# Patient Record
Sex: Female | Born: 1995 | Race: White | Hispanic: No | Marital: Single | Smoking: Current every day smoker
Health system: Southern US, Community
[De-identification: ages and names within clinical notes are randomized; demographics above are authoritative.]

## PROBLEM LIST (undated history)

## (undated) ENCOUNTER — Inpatient Hospital Stay (HOSPITAL_COMMUNITY): Payer: Self-pay

## (undated) DIAGNOSIS — J45909 Unspecified asthma, uncomplicated: Secondary | ICD-10-CM

## (undated) HISTORY — PX: OTHER SURGICAL HISTORY: SHX169

## (undated) HISTORY — DX: Unspecified asthma, uncomplicated: J45.909

## (undated) HISTORY — PX: TYMPANOSTOMY TUBE PLACEMENT: SHX32

---
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
## 2014-01-01 MED ORDER — IBUPROFEN 600 MG PO TABS: 600.0000 mg | ORAL_TABLET | Freq: Four times a day (QID) | ORAL | Status: DC | PRN

## 2014-01-01 NOTE — Discharge Summary (Signed)
Obstetric Discharge Summary Reason for Admission: onset of labor Prenatal Procedures: ultrasound Intrapartum Procedures: spontaneous vaginal delivery Postpartum Procedures: none Complications-Operative and Postpartum: none HEMOGLOBIN  Date Value Ref Range Status  12/30/2013 12.8 12.0 - 15.0 g/dL Final   HCT  Date Value Ref Range Status  12/30/2013 36.9 36.0 - 46.0 % Final   Hospital course: Teen G1 at 40 wks presented for active labor. [facility] at 9 weeks of pregnancy. +chlamydia during first trimester of pregnancy. Pregnancy dated by first trimester ultrasound.Progressed well.  12/30/13 a viable and healthy female was delivered via Vaginal, Spontaneous Delivery (Presentation: ;  ).  APGAR: 9, 9; weight 8 lb 0.8 oz (3650 g).   Placenta status: Intact, Spontaneous.  Cord: 3 vessels with the following complications: None.   Anesthesia: Epidural  Episiotomy: None Lacerations: 2nd degree;Perineal Suture Repair: 3.0 vicryl rapide Est. Blood Loss (mL): 450  Physical Exam:  General: alert, cooperative and no distress Lochia: appropriate Uterine Fundus: firm Incision: n/a DVT Evaluation: No evidence of DVT seen on physical exam.  Discharge Diagnoses: Term Pregnancy-delivered  Discharge Information: Date: 01/01/2014 Activity: pelvic rest Diet: routine Medications: PNV and Ibuprofen Condition: stable Instructions: refer to practice specific booklet Discharge to: home Follow-up Information    Follow up with [facility]. Schedule an appointment as soon as possible for a visit in 1 month.   Contact information:   [address] [place] [address] [phone]       Newborn Data: Live born female  Birth Weight: 8 lb 0.8 oz (3650 g) APGAR: 9, 9  Home with mother.  Sandra Meadows 01/01/2014, 10:43 AM

## 2014-01-01 NOTE — Lactation Note (Addendum)
This note was copied from the chart of Sandra [place][name]. Lactation Consultation Note Mom isn't want to BF w/NS, or wear shells and bra. Wanting to give bottle w/formula because the baby is hungry. Mom is flat and unable to put baby to breast for adequate BF and milk transfer. She states she doesn't like them. Gave the option of pumping and giving breast milk in bottle. Unable to get any colostrum at this time to give to baby w/pump. Explained keeping the stimulation every 3 hrs. To encourage the milk to come. Mom has 2 manual pumps at home and will be getting a DEBP through her insurance. Baby does have a recessed chin, limited tongue movement, and upper lip frenulum. Mom said she probably will be pumping and bottle feeding, and supplementing until her milk comes in. Encouraged to come to out pt. Services either support group or LC OP services. Patient Name: Sandra Meadows MVHQI'OToday's Date: 01/01/2014 Reason for consult: Follow-up assessment   Maternal Data    Feeding Feeding Type: Formula Nipple Type: Slow - flow  LATCH Score/Interventions                      Lactation Tools Discussed/Used     Consult Status Consult Status: Complete Date: 01/01/14 Follow-up type: Call as needed    Raudel Bazen, Diamond NickelLAURA G 01/01/2014, 4:52 AM

## 2014-01-01 NOTE — Discharge Instructions (Signed)
Postpartum Care After Vaginal Delivery °After you deliver your newborn (postpartum period), the usual stay in the hospital is 24-72 hours. If there were problems with your labor or delivery, or if you have other medical problems, you might be in the hospital longer.  °While you are in the hospital, you will receive help and instructions on how to care for yourself and your newborn during the postpartum period.  °While you are in the hospital: °· Be sure to tell your nurses if you have pain or discomfort, as well as where you feel the pain and what makes the pain worse. °· If you had an incision made near your vagina (episiotomy) or if you had some tearing during delivery, the nurses may put ice packs on your episiotomy or tear. The ice packs may help to reduce the pain and swelling. °· If you are breastfeeding, you may feel uncomfortable contractions of your uterus for a couple of weeks. This is normal. The contractions help your uterus get back to normal size. °· It is normal to have some bleeding after delivery. °· For the first 1-3 days after delivery, the flow is red and the amount may be similar to a period. °· It is common for the flow to start and stop. °· In the first few days, you may pass some small clots. Let your nurses know if you begin to pass large clots or your flow increases. °· Do not  flush blood clots down the toilet before having the nurse look at them. °· During the next 3-10 days after delivery, your flow should become more watery and pink or brown-tinged in color. °· Ten to fourteen days after delivery, your flow should be a small amount of yellowish-white discharge. °· The amount of your flow will decrease over the first few weeks after delivery. Your flow may stop in 6-8 weeks. Most women have had their flow stop by 12 weeks after delivery. °· You should change your sanitary pads frequently. °· Wash your hands thoroughly with soap and water for at least 20 seconds after changing pads, using  the toilet, or before holding or feeding your newborn. °· You should feel like you need to empty your bladder within the first 6-8 hours after delivery. °· In case you become weak, lightheaded, or faint, call your nurse before you get out of bed for the first time and before you take a shower for the first time. °· Within the first few days after delivery, your breasts may begin to feel tender and full. This is called engorgement. Breast tenderness usually goes away within 48-72 hours after engorgement occurs. You may also notice milk leaking from your breasts. If you are not breastfeeding, do not stimulate your breasts. Breast stimulation can make your breasts produce more milk. °· Spending as much time as possible with your newborn is very important. During this time, you and your newborn can feel close and get to know each other. Having your newborn stay in your room (rooming in) will help to strengthen the bond with your newborn.  It will give you time to get to know your newborn and become comfortable caring for your newborn. °· Your hormones change after delivery. Sometimes the hormone changes can temporarily cause you to feel sad or tearful. These feelings should not last more than a few days. If these feelings last longer than that, you should talk to your caregiver. °· If desired, talk to your caregiver about methods of family planning or contraception. °·   Talk to your caregiver about immunizations. Your caregiver may want you to have the following immunizations before leaving the hospital: °· Tetanus, diphtheria, and pertussis (Tdap) or tetanus and diphtheria (Td) immunization. It is very important that you and your family (including grandparents) or others caring for your newborn are up-to-date with the Tdap or Td immunizations. The Tdap or Td immunization can help protect your newborn from getting ill. °· Rubella immunization. °· Varicella (chickenpox) immunization. °· Influenza immunization. You should  receive this annual immunization if you did not receive the immunization during your pregnancy. °Document Released: 10/19/2006 Document Revised: 09/16/2011 Document Reviewed: 08/19/2011 °ExitCare® Patient Information ©2015 [employer]. This information is not intended to replace advice given to you by your health care provider. Make sure you discuss any questions you have with your health care provider. ° °Contraception Choices °Contraception (birth control) is the use of any methods or devices to prevent pregnancy. Below are some methods to help avoid pregnancy. °HORMONAL METHODS  °· Contraceptive implant. This is a thin, plastic tube containing progesterone hormone. It does not contain estrogen hormone. Your health care provider inserts the tube in the inner part of the upper arm. The tube can remain in place for up to 3 years. After 3 years, the implant must be removed. The implant prevents the ovaries from releasing an egg (ovulation), thickens the cervical mucus to prevent sperm from entering the uterus, and thins the lining of the inside of the uterus. °· Progesterone-only injections. These injections are given every 3 months by your health care provider to prevent pregnancy. This synthetic progesterone hormone stops the ovaries from releasing eggs. It also thickens cervical mucus and changes the uterine lining. This makes it harder for sperm to survive in the uterus. °· Birth control pills. These pills contain estrogen and progesterone hormone. They work by preventing the ovaries from releasing eggs (ovulation). They also cause the cervical mucus to thicken, preventing the sperm from entering the uterus. Birth control pills are prescribed by a health care provider. Birth control pills can also be used to treat heavy periods. °· Minipill. This type of birth control pill contains only the progesterone hormone. They are taken every day of each month and must be prescribed by your health care provider. °· Birth  control patch. The patch contains hormones similar to those in birth control pills. It must be changed once a week and is prescribed by a health care provider. °· Vaginal ring. The ring contains hormones similar to those in birth control pills. It is left in the vagina for 3 weeks, removed for 1 week, and then a new one is put back in place. The patient must be comfortable inserting and removing the ring from the vagina. A health care provider's prescription is necessary. °· Emergency contraception. Emergency contraceptives prevent pregnancy after unprotected sexual intercourse. This pill can be taken right after sex or up to 5 days after unprotected sex. It is most effective the sooner you take the pills after having sexual intercourse. Most emergency contraceptive pills are available without a prescription. Check with your pharmacist. Do not use emergency contraception as your only form of birth control. °BARRIER METHODS  °· Female condom. This is a thin sheath (latex or rubber) that is worn over the penis during sexual intercourse. It can be used with spermicide to increase effectiveness. °· Female condom. This is a soft, loose-fitting sheath that is put into the vagina before sexual intercourse. °· Diaphragm. This is a soft, latex, dome-shaped barrier that   must be fitted by a health care provider. It is inserted into the vagina, along with a spermicidal jelly. It is inserted before intercourse. The diaphragm should be left in the vagina for 6 to 8 hours after intercourse. °· Cervical cap. This is a round, soft, latex or plastic cup that fits over the cervix and must be fitted by a health care provider. The cap can be left in place for up to 48 hours after intercourse. °· Sponge. This is a soft, circular piece of polyurethane foam. The sponge has spermicide in it. It is inserted into the vagina after wetting it and before sexual intercourse. °· Spermicides. These are chemicals that kill or block sperm from entering  the cervix and uterus. They come in the form of creams, jellies, suppositories, foam, or tablets. They do not require a prescription. They are inserted into the vagina with an applicator before having sexual intercourse. The process must be repeated every time you have sexual intercourse. °INTRAUTERINE CONTRACEPTION °· Intrauterine device (IUD). This is a T-shaped device that is put in a woman's uterus during a menstrual period to prevent pregnancy. There are 2 types: °¨ Copper IUD. This type of IUD is wrapped in copper wire and is placed inside the uterus. Copper makes the uterus and fallopian tubes produce a fluid that kills sperm. It can stay in place for 10 years. °¨ Hormone IUD. This type of IUD contains the hormone progestin (synthetic progesterone). The hormone thickens the cervical mucus and prevents sperm from entering the uterus, and it also thins the uterine lining to prevent implantation of a fertilized egg. The hormone can weaken or kill the sperm that get into the uterus. It can stay in place for 3-5 years, depending on which type of IUD is used. °PERMANENT METHODS OF CONTRACEPTION °· Female tubal ligation. This is when the woman's fallopian tubes are surgically sealed, tied, or blocked to prevent the egg from traveling to the uterus. °· Hysteroscopic sterilization. This involves placing a small coil or insert into each fallopian tube. Your doctor uses a technique called hysteroscopy to do the procedure. The device causes scar tissue to form. This results in permanent blockage of the fallopian tubes, so the sperm cannot fertilize the egg. It takes about 3 months after the procedure for the tubes to become blocked. You must use another form of birth control for these 3 months. °· Female sterilization. This is when the female has the tubes that carry sperm tied off (vasectomy). This blocks sperm from entering the vagina during sexual intercourse. After the procedure, the man can still ejaculate fluid  (semen). °NATURAL PLANNING METHODS °· Natural family planning. This is not having sexual intercourse or using a barrier method (condom, diaphragm, cervical cap) on days the woman could become pregnant. °· Calendar method. This is keeping track of the length of each menstrual cycle and identifying when you are fertile. °· Ovulation method. This is avoiding sexual intercourse during ovulation. °· Symptothermal method. This is avoiding sexual intercourse during ovulation, using a thermometer and ovulation symptoms. °· Post-ovulation method. This is timing sexual intercourse after you have ovulated. °Regardless of which type or method of contraception you choose, it is important that you use condoms to protect against the transmission of sexually transmitted infections (STIs). Talk with your health care provider about which form of contraception is most appropriate for you. °Document Released: 12/22/2004 Document Revised: 12/27/2012 Document Reviewed: 06/16/2012 °ExitCare® Patient Information ©2015 [employer]. This information is not intended to replace advice   given to you by your health care provider. Make sure you discuss any questions you have with your health care provider. ° °

## 2014-02-01 ENCOUNTER — Ambulatory Visit (INDEPENDENT_AMBULATORY_CARE_PROVIDER_SITE_OTHER): Payer: Medicaid Other | Admitting: Obstetrics & Gynecology

## 2014-02-01 ENCOUNTER — Encounter: Payer: Self-pay | Admitting: Obstetrics & Gynecology

## 2014-02-01 MED ORDER — MISOPROSTOL 200 MCG PO TABS: ORAL_TABLET | ORAL | Status: DC

## 2014-02-01 NOTE — Progress Notes (Signed)
  Subjective:     Sandra LeavellVictoria R Meadows is a 19 y.o. female who presents for a postpartum visit. She is 4 weeks postpartum following a spontaneous vaginal delivery. I have fully reviewed the prenatal and intrapartum course. The delivery was at 40 gestational weeks. Outcome: spontaneous vaginal delivery. Anesthesia: epidural. Postpartum course has been "great". Baby's course has been "great". Baby is feeding by bottle - Similac Advance. Bleeding no bleeding. Bowel function is normal. Bladder function is normal. Patient is not sexually active. Contraception method is abstinence. Postpartum depression screening: negative.  The following portions of the patient's history were reviewed and updated as appropriate: allergies, current medications, past family history, past medical history, past social history, past surgical history and problem list.  Review of Systems Pertinent items are noted in HPI.   Objective:    BP 117/74 mmHg  Pulse 89  Temp(Src) 98.2 F (36.8 C) (Oral)  Resp 18  Wt 181 lb 11.2 oz (82.419 kg)  Breastfeeding? No  General:  alert   Breasts:  inspection negative, no nipple discharge or bleeding, no masses or nodularity palpable  Lungs: clear to auscultation bilaterally  Heart:  regular rate and rhythm, S1, S2 normal, no murmur, click, rub or gallop  Abdomen: soft, non-tender; bowel sounds normal; no masses,  no organomegaly   Vulva:  normal  Vagina: not evaluated  Cervix:  anteverted  Corpus: normal  Adnexa:  normal adnexa  Rectal Exam: Not performed.        I attempted to do a Mirena insertion. However the tenaculem ripped throught both the anterior and posterior lips of the cervix. Bleeding was controlled with Monsel's.      Assessment:     Normal postpartum exam. Pap smear not done at today's visit.   Plan:    1. Contraception: She will come back for a Mirena

## 2014-02-02 LAB — POCT PREGNANCY, URINE: Preg Test, Ur: NEGATIVE

## 2014-02-09 ENCOUNTER — Encounter: Payer: Self-pay | Admitting: General Practice

## 2014-02-21 ENCOUNTER — Ambulatory Visit: Payer: Medicaid Other | Admitting: Obstetrics & Gynecology

## 2014-03-21 ENCOUNTER — Ambulatory Visit (INDEPENDENT_AMBULATORY_CARE_PROVIDER_SITE_OTHER): Payer: BLUE CROSS/BLUE SHIELD | Admitting: Obstetrics & Gynecology

## 2014-03-21 ENCOUNTER — Encounter: Payer: Self-pay | Admitting: Obstetrics & Gynecology

## 2014-03-21 VITALS — BP 127/73 | HR 96 | Temp 98.5°F | Wt 176.3 lb

## 2014-03-21 DIAGNOSIS — Z30430 Encounter for insertion of intrauterine contraceptive device: Secondary | ICD-10-CM

## 2014-03-21 DIAGNOSIS — Z3202 Encounter for pregnancy test, result negative: Secondary | ICD-10-CM

## 2014-03-21 LAB — POCT PREGNANCY, URINE: PREG TEST UR: NEGATIVE

## 2014-03-21 MED ORDER — LEVONORGESTREL 20 MCG/24HR IU IUD
INTRAUTERINE_SYSTEM | Freq: Once | INTRAUTERINE | Status: AC
  Administered 2014-03-21: 1 via INTRAUTERINE

## 2014-03-21 NOTE — Addendum Note (Signed)
Addended by: Louanna RawAMPBELL, Tomorrow Dehaas M on: 03/21/2014 03:26 PM   Modules accepted: Orders

## 2014-03-21 NOTE — Progress Notes (Signed)
   Subjective:    Patient ID: Sandra LeavellVictoria R Badia, female    DOB: [birth year], 19 y.o.   MRN: [id number]  HPI  19 yo P1 here for a second attempt at Mirena insertion. She forgot to take the cytotec last night.  Review of Systems     Objective:   Physical Exam  UPT negative, consent signed, Time out procedure done. Cervix prepped with betadine  Mirena was easily placed and the strings were cut to 3-4 cm. Uterus sounded to 9 cm. She tolerated the procedure well.        Assessment & Plan:  Contraception- Mirena RTC 4 weeks for string check

## 2014-04-25 ENCOUNTER — Encounter: Payer: Self-pay | Admitting: Obstetrics & Gynecology

## 2014-04-25 ENCOUNTER — Ambulatory Visit (INDEPENDENT_AMBULATORY_CARE_PROVIDER_SITE_OTHER): Payer: Self-pay | Admitting: Obstetrics & Gynecology

## 2014-04-25 VITALS — BP 143/99 | HR 98 | Temp 98.0°F | Ht 62.0 in | Wt 167.8 lb

## 2014-04-25 DIAGNOSIS — Z30431 Encounter for routine checking of intrauterine contraceptive device: Secondary | ICD-10-CM

## 2014-04-25 NOTE — Progress Notes (Signed)
   Subjective:    Patient ID: Sandra LeavellVictoria R Meadows, female    DOB: [birth year], 19 y.o.   MRN: [id number]  HPI  19 yo P211 with a 573 month old son is here today for a string check. She got her Mirena a month ago. She has some daily spotting, but is pleased with the Mirena. No partner pain with sex.  Review of Systems     Objective:   Physical Exam [id number] Breathing and ambulating normally Mirena strings seen        Assessment & Plan:  Contraception- Mirena RTC 1 year/prn sooner

## 2015-01-22 ENCOUNTER — Ambulatory Visit (INDEPENDENT_AMBULATORY_CARE_PROVIDER_SITE_OTHER): Payer: BLUE CROSS/BLUE SHIELD | Admitting: Family Medicine

## 2015-01-22 VITALS — BP 124/78 | HR 85 | Temp 98.8°F | Resp 17 | Ht 62.5 in | Wt 157.0 lb

## 2015-01-22 DIAGNOSIS — Z111 Encounter for screening for respiratory tuberculosis: Secondary | ICD-10-CM

## 2015-01-22 DIAGNOSIS — R748 Abnormal levels of other serum enzymes: Secondary | ICD-10-CM | POA: Diagnosis not present

## 2015-01-22 DIAGNOSIS — Z130 Encounter for screening for diseases of the blood and blood-forming organs and certain disorders involving the immune mechanism: Secondary | ICD-10-CM

## 2015-01-22 DIAGNOSIS — Z0000 Encounter for general adult medical examination without abnormal findings: Secondary | ICD-10-CM

## 2015-01-22 NOTE — Progress Notes (Signed)
  Tuberculosis Risk Questionnaire  1. No Were you born outside the [place] in one of the following parts of the world: [place], [place], [place], [place] or [place]?    2. No Have you traveled outside the [place] and lived for more than one month in one of the following parts of the world: [place], [place], [place], [place] or [place]?    3. No Do you have a compromised immune system such as from any of the following conditions:HIV/AIDS, organ or bone marrow transplantation, diabetes, immunosuppressive medicines (e.g. Prednisone, Remicaide), leukemia, lymphoma, cancer of the head or neck, gastrectomy or jejunal bypass, end-stage renal disease (on dialysis), or silicosis?     4. Yes nursing Have you ever or do you plan on working in: a residential care center, a health care facility, a jail or prison or homeless shelter?    5. No Have you ever: injected illegal drugs, used crack cocaine, lived in a homeless shelter  or been in jail or prison?     6. No Have you ever been exposed to anyone with infectious tuberculosis?    Tuberculosis Symptom Questionnaire  Do you currently have any of the following symptoms?  1. No Unexplained cough lasting more than 3 weeks?   2. No Unexplained fever lasting more than 3 weeks.   3. No Night Sweats (sweating that leaves the bedclothes and sheets wet)     4. No Shortness of Breath   5. No Chest Pain   6. No Unintentional weight loss    7. No Unexplained fatigue (very tired for no reason)

## 2015-01-22 NOTE — Progress Notes (Signed)
      Chief Complaint:  Chief Complaint  Patient presents with  . Immunizations    TB,     HPI: Sandra Meadows is a 20 y.o. 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   Past Medical History  Diagnosis Date  . Childhood asthma    Past Surgical History  Procedure Laterality Date  . Ears    . Tympanostomy tube placement     Social History   Social History  . Marital Status: Single    Spouse Name: N/A  . Number of Children: N/A  . Years of Education: N/A   Social History Main Topics  . Smoking status: Current Every Day Smoker -- 0.50 packs/day for 6 years    Types: Cigarettes  . Smokeless tobacco: Never Used  . Alcohol Use: No  . Drug Use: No  . Sexual Activity: Not Currently     Comment: HAS QUIT   Other Topics Concern  . None   Social History Narrative   Family History  Problem Relation Age of Onset  . Cancer Maternal Grandfather    No Known Allergies Prior to Admission medications   Not on File     ROS: The patient denies fevers, chills, night sweats, unintentional weight loss, chest pain, palpitations, wheezing, dyspnea on exertion, nausea, vomiting, abdominal pain, dysuria, hematuria, melena, numbness, weakness, or tingling.  All other systems have been reviewed and were otherwise negative with the exception of those mentioned in the HPI and as above.    PHYSICAL EXAM: Filed Vitals:   01/22/15 1701  BP: 124/78  Pulse: 85  Temp: 98.8 F (37.1 C)  Resp: 17   Body mass index is 28.24 kg/(m^2).   General: Alert, no acute distress HEENT:  Normocephalic, atraumatic, oropharynx patent. EOMI, PERRLA Cardiovascular:  Regular rate and rhythm, no rubs murmurs or  gallops.  No Carotid bruits, radial pulse intact. No pedal edema.  Respiratory: Clear to auscultation bilaterally.  No wheezes, rales, or rhonchi.  No cyanosis, no use of accessory musculature Abdominal: No organomegaly, abdomen is soft and non-tender, positive bowel sounds. No masses. Skin: No rashes. Neurologic: Facial musculature symmetric. Psychiatric: Patient acts appropriately throughout our interaction. Lymphatic: No cervical or submandibular lymphadenopathy Musculoskeletal: Gait intact. No edema, tenderness   LABS: Results for orders placed or performed in visit on 03/21/14  Pregnancy, urine POC  Result Value Ref Range   Preg Test, Ur NEGATIVE NEGATIVE     EKG/XRAY:   Primary read interpreted by Dr. Marin Comment at [facility].   ASSESSMENT/PLAN: Encounter Diagnoses  Name Primary?  . Annual physical exam Yes  . Screening-pulmonary TB   . Screening for deficiency anemia   . Abnormal liver enzymes    Labs pending Will need titers for school PPD given Fu in 2 days   Gross sideeffects, risk and benefits, and alternatives of medications d/w patient. Patient is aware that all medications have potential sideeffects and we are unable to predict every sideeffect or drug-drug interaction that may occur.  Jaxxen Voong DO  01/22/2015 5:26 PM

## 2015-01-23 LAB — CBC
HCT: 40.9 % (ref 36.0–46.0)
Hemoglobin: 13.8 g/dL (ref 12.0–15.0)
MCH: 29.1 pg (ref 26.0–34.0)
MCHC: 33.7 g/dL (ref 30.0–36.0)
MCV: 86.3 fL (ref 78.0–100.0)
MPV: 12.2 fL (ref 8.6–12.4)
Platelets: 227 10*3/uL (ref 150–400)
RBC: 4.74 MIL/uL (ref 3.87–5.11)
RDW: 15 % (ref 11.5–15.5)
WBC: 10.4 10*3/uL (ref 4.0–10.5)

## 2015-01-23 LAB — COMPLETE METABOLIC PANEL WITH GFR
Alkaline Phosphatase: 64 U/L (ref 33–115)
BUN: 8 mg/dL (ref 7–25)
Calcium: 9.4 mg/dL (ref 8.6–10.2)
Chloride: 105 mmol/L (ref 98–110)
Creat: 0.68 mg/dL (ref 0.50–1.10)
GFR, Est African American: >89 mL/min (ref >=60)
GFR, Est Non African American: >89 mL/min (ref >=60)
Glucose, Bld: 90 mg/dL (ref 65–99)
Potassium: 4.6 mmol/L (ref 3.5–5.3)
Total Bilirubin: 0.4 mg/dL (ref 0.2–1.2)
Total Protein: 7.3 g/dL (ref 6.1–8.1)

## 2015-01-23 LAB — COMPLETE METABOLIC PANEL WITHOUT GFR
ALT: 14 U/L (ref 6–29)
AST: 13 U/L (ref 10–30)
Albumin: 4.2 g/dL (ref 3.6–5.1)
CO2: 27 mmol/L (ref 20–31)
Sodium: 139 mmol/L (ref 135–146)

## 2015-01-23 LAB — MEASLES/MUMPS/RUBELLA IMMUNITY
Mumps IgG: 73.1 [AU]/ml — ABNORMAL HIGH (ref <9.00)
Rubella: 2.15 Index — ABNORMAL HIGH (ref <0.90)
Rubeola IgG: 136 [AU]/ml — ABNORMAL HIGH (ref <25.00)

## 2015-01-23 LAB — VARICELLA ZOSTER ANTIBODY, IGG: Varicella IgG: 204.5 Index — ABNORMAL HIGH (ref <135.00)

## 2015-01-24 ENCOUNTER — Ambulatory Visit (INDEPENDENT_AMBULATORY_CARE_PROVIDER_SITE_OTHER): Payer: BLUE CROSS/BLUE SHIELD | Admitting: *Deleted

## 2015-01-24 DIAGNOSIS — Z111 Encounter for screening for respiratory tuberculosis: Secondary | ICD-10-CM

## 2015-01-24 LAB — TB SKIN TEST
Induration: 0 mm
TB Skin Test: NEGATIVE

## 2015-01-24 NOTE — Progress Notes (Signed)
   Subjective:    Patient ID: Sandra Meadows, female    DOB: 04/14/1995, 19 y.o.   MRN: [id number]  HPI    Review of Systems     Objective:   Physical Exam        Assessment & Plan:   

## 2015-02-05 ENCOUNTER — Ambulatory Visit (INDEPENDENT_AMBULATORY_CARE_PROVIDER_SITE_OTHER): Payer: BLUE CROSS/BLUE SHIELD

## 2015-02-05 DIAGNOSIS — Z111 Encounter for screening for respiratory tuberculosis: Secondary | ICD-10-CM | POA: Diagnosis not present

## 2015-02-05 DIAGNOSIS — Z23 Encounter for immunization: Secondary | ICD-10-CM | POA: Diagnosis not present

## 2015-02-05 NOTE — Progress Notes (Signed)
   Subjective:    Patient ID: Sandra Meadows, female    DOB: 1995-09-27, 20 y.o.   MRN: [id number]  HPI    Review of Systems     Objective:   Physical Exam        Assessment & Plan:

## 2015-02-05 NOTE — Progress Notes (Signed)
  Tuberculosis Risk Questionnaire  1. No Were you born outside the [place] in one of the following parts of the world: [place], [place], [place], [place] or Eastern [place]?    2. No Have you traveled outside the [place] and lived for more than one month in one of the following parts of the world: [place], [place], [place], [place] or [place]?    3. No Do you have a compromised immune system such as from any of the following conditions:HIV/AIDS, organ or bone marrow transplantation, diabetes, immunosuppressive medicines (e.g. Prednisone, Remicaide), leukemia, lymphoma, cancer of the head or neck, gastrectomy or jejunal bypass, end-stage renal disease (on dialysis), or silicosis?     4. Yes Have you ever or do you plan on working in: a residential care center, a health care facility, a jail or prison or homeless shelter? Health care facility     5. No Have you ever: injected illegal drugs, used crack cocaine, lived in a homeless shelter  or been in jail or prison?     6. No Have you ever been exposed to anyone with infectious tuberculosis?    Tuberculosis Symptom Questionnaire  Do you currently have any of the following symptoms?  1. No Unexplained cough lasting more than 3 weeks?   2. No Unexplained fever lasting more than 3 weeks.   3. No Night Sweats (sweating that leaves the bedclothes and sheets wet)     4. No Shortness of Breath   5. No Chest Pain   6. No Unintentional weight loss    7. No Unexplained fatigue (very tired for no reason)   

## 2015-02-05 NOTE — Progress Notes (Deleted)
   Subjective:    Patient ID: Sandra Meadows, female    DOB: 10/20/1995, 20 y.o.   MRN: [id number]  HPI    Review of Systems     Objective:   Physical Exam        Assessment & Plan:   

## 2015-02-08 ENCOUNTER — Ambulatory Visit (INDEPENDENT_AMBULATORY_CARE_PROVIDER_SITE_OTHER): Payer: BLUE CROSS/BLUE SHIELD | Admitting: *Deleted

## 2015-02-08 DIAGNOSIS — Z111 Encounter for screening for respiratory tuberculosis: Secondary | ICD-10-CM

## 2015-02-08 LAB — TB SKIN TEST
Induration: 0 mm
TB SKIN TEST: NEGATIVE

## 2016-04-22 IMAGING — US US OB TRANSVAGINAL
1 series · 14 of 28 positions shown · non-contrast
Comparison: None.

CLINICAL DATA: Pelvic pain and cramping. Vaginal bleeding.
Gestational age by LMP of 5 weeks 4 days.

EXAM:
OBSTETRIC <14 WK US AND TRANSVAGINAL OB US
TECHNIQUE: Both transabdominal and transvaginal ultrasound examinations were
performed for complete evaluation of the gestation as well as the
maternal uterus, adnexal regions, and pelvic cul-de-sac.
Transvaginal technique was performed to assess early pregnancy.

[Series 1: us ob transvaginal · 42 acquisitions, 14 frames shown]
[im 2/42]
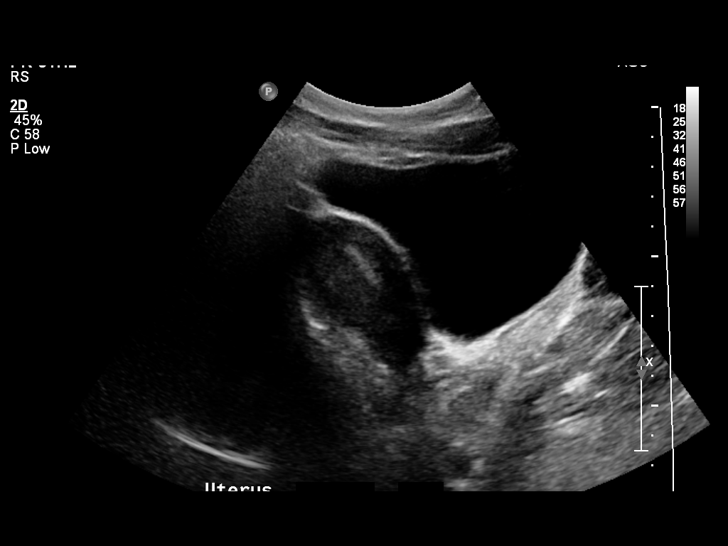
[im 5/42]
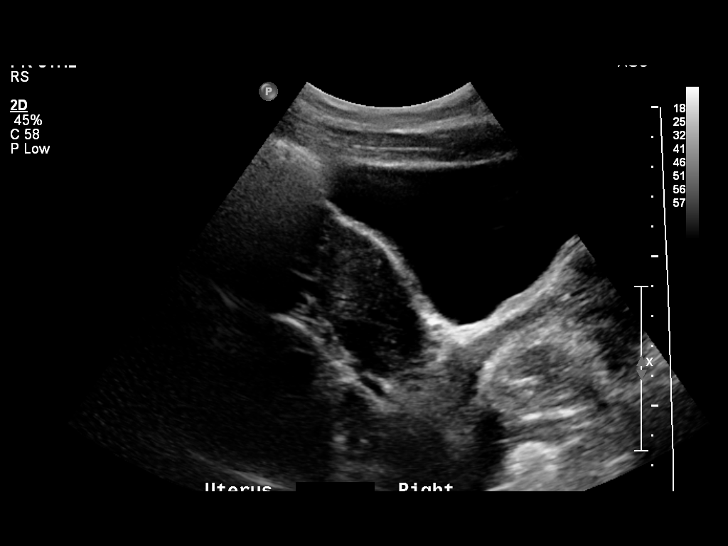
[im 8/42]
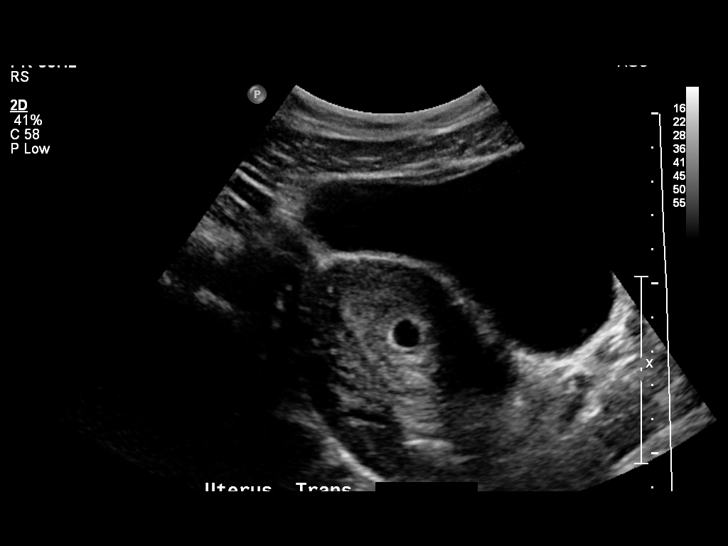
[im 11/42]
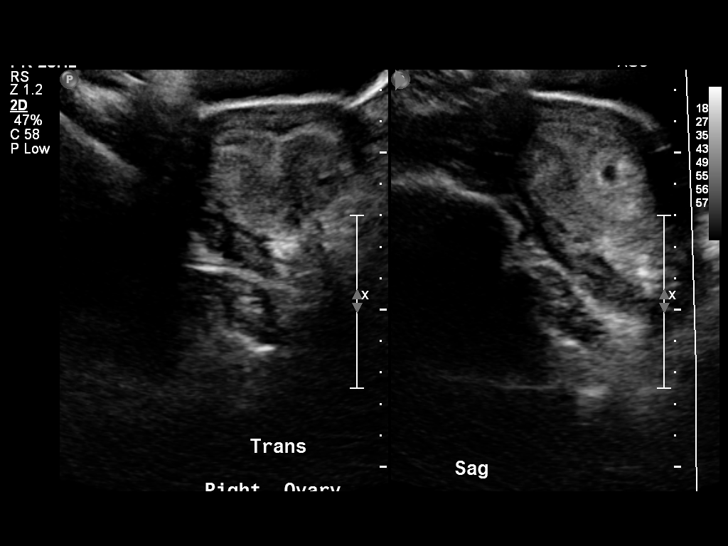
[im 14/42]
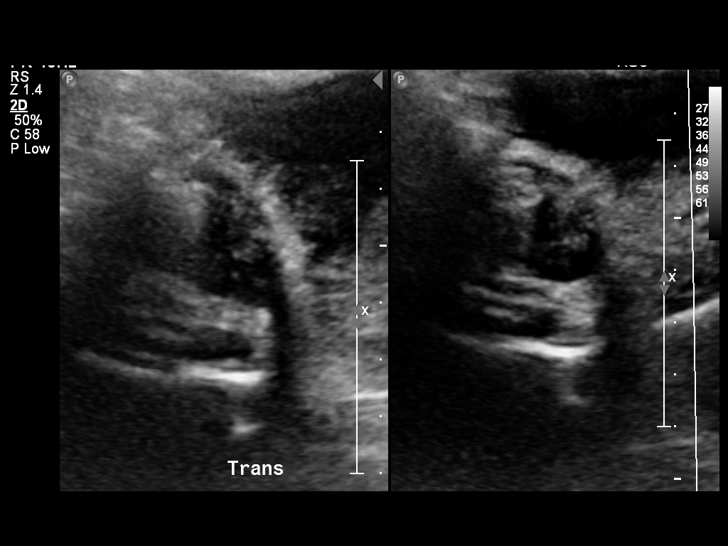
[im 17/42]
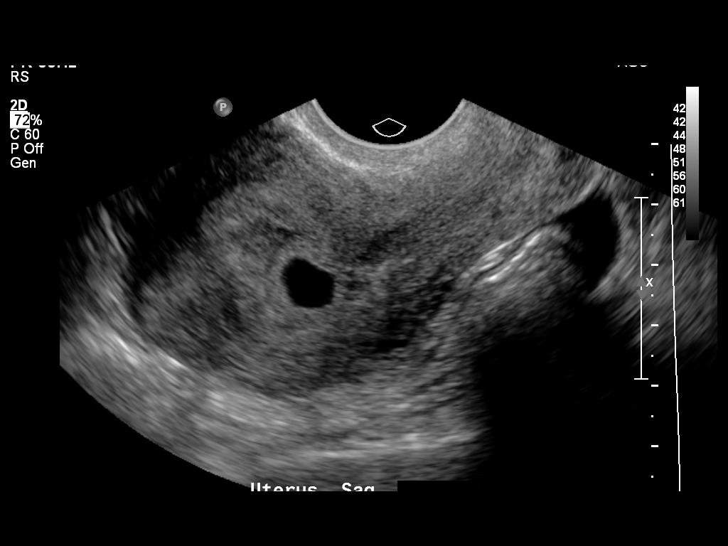
[im 20/42]
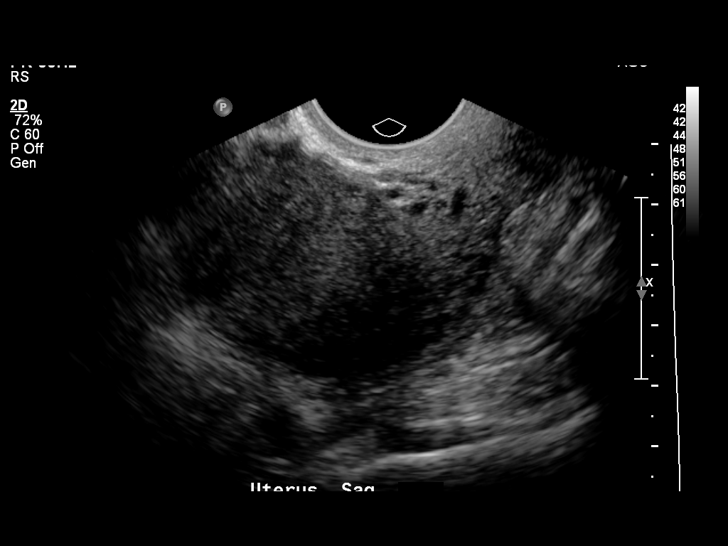
[im 23/42]
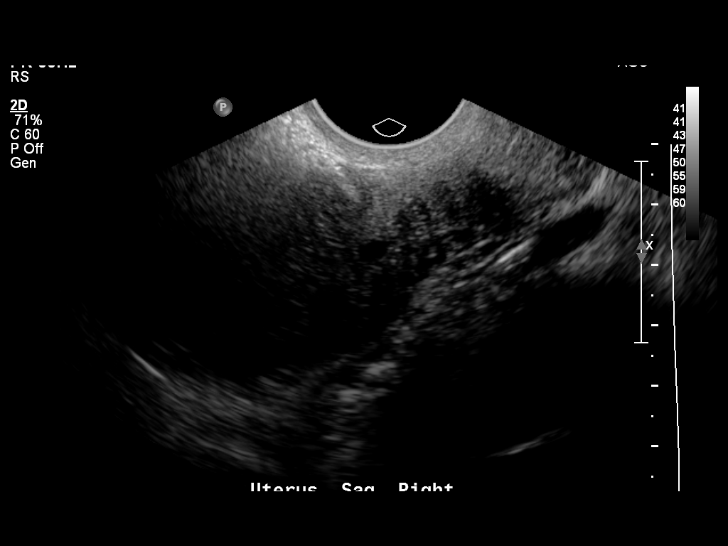
[im 26/42]
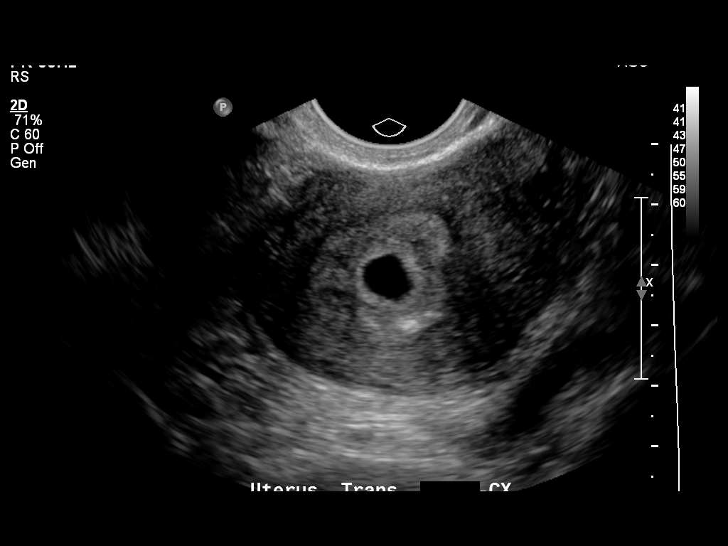
[im 29/42]
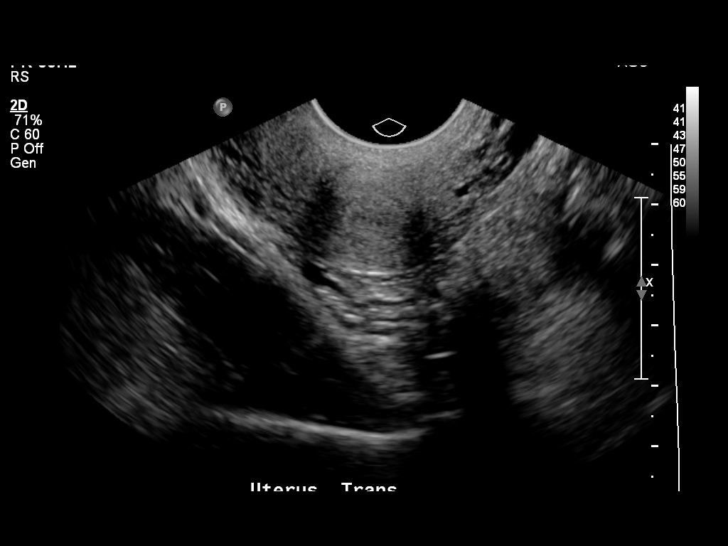
[im 32/42]
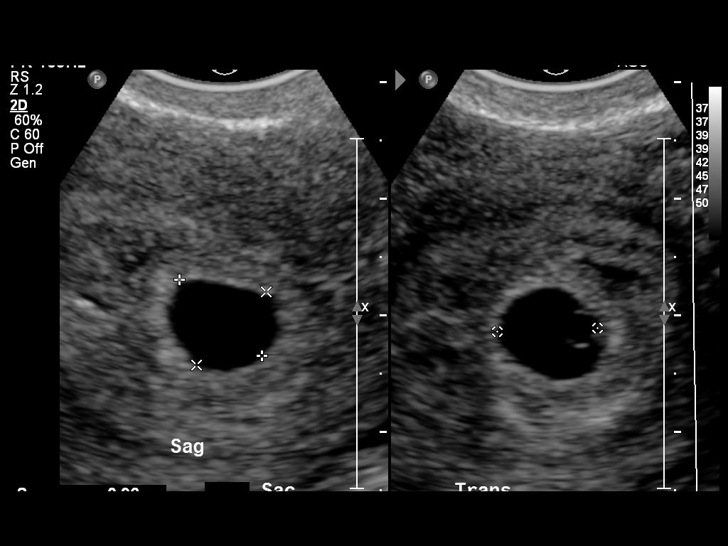
[im 35/42]
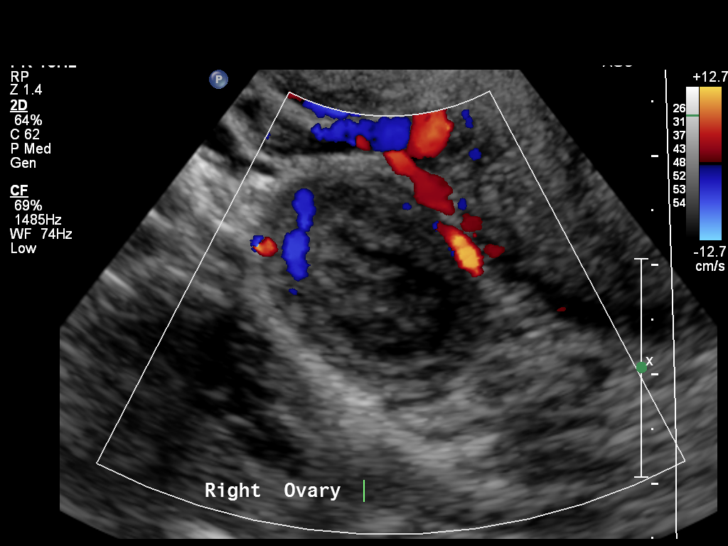
[im 38/42]
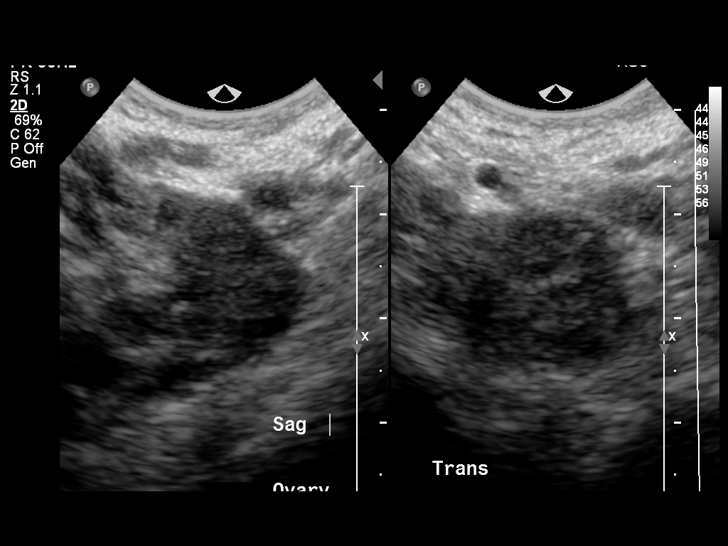
[im 42/42]
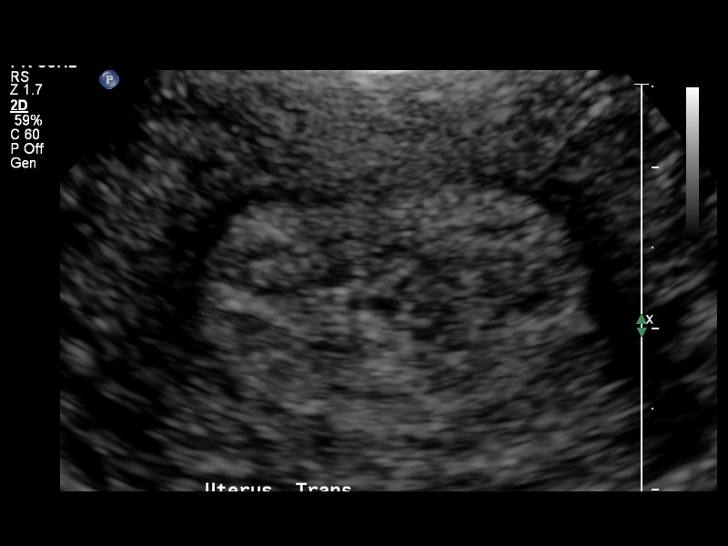

[14 of 28 positions shown; findings below may reference images not displayed]

FINDINGS: Intrauterine gestational sac: Visualized/normal in shape.

Yolk sac:  Visualized

Embryo:  Not visualized

MSD:  9  mm   5 w   4  d

Maternal uterus/adnexae: Tiny subchorionic hemorrhage noted. Both
ovaries are normal in appearance. Right ovarian corpus luteum cyst
noted. No adnexal mass or free fluid identified.
IMPRESSION: Single intrauterine gestational sac, with estimated gestational age
of 5 weeks 4 days by mean sac diameter. This is concordant with LMP.

No significant maternal uterine or adnexal abnormality identified.

## 2016-06-16 IMAGING — US US MFM OB COMPLETE +14 WKS
1 series · 13 of 28 positions shown · non-contrast
Comparison: none

[Series 1: us mfm ob complete +14 wks · 0.09mm/px · 13 of 32 slices shown]
[im 2/32]
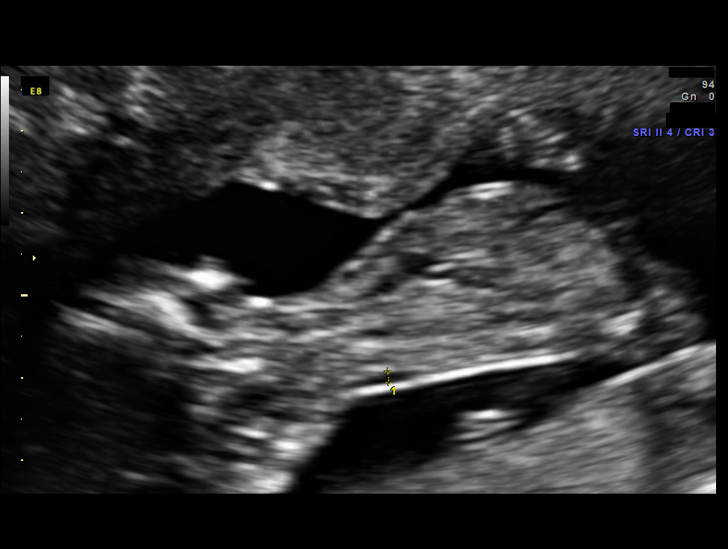
[im 4/32]
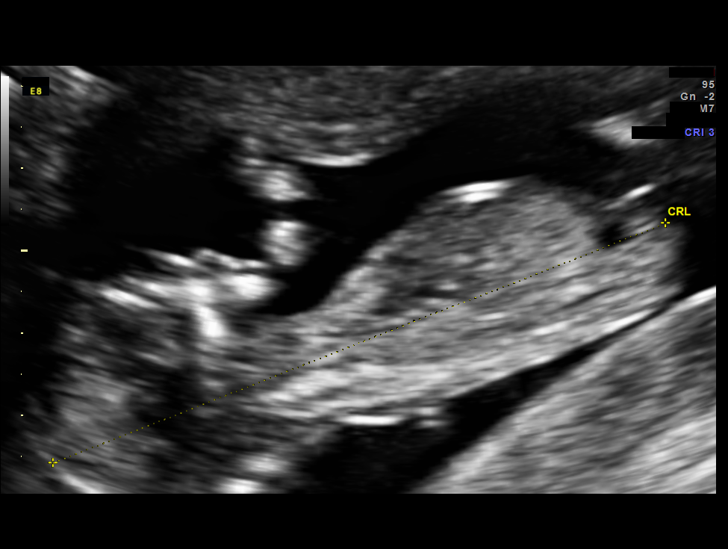
[im 6/32]
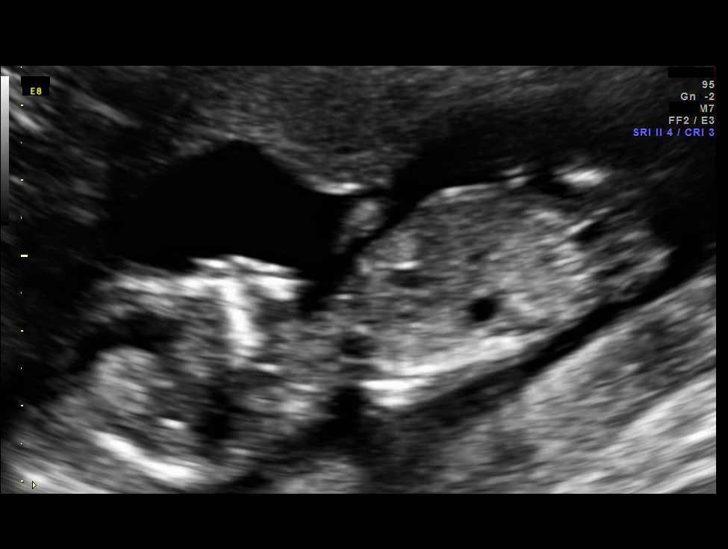
[im 9/32]
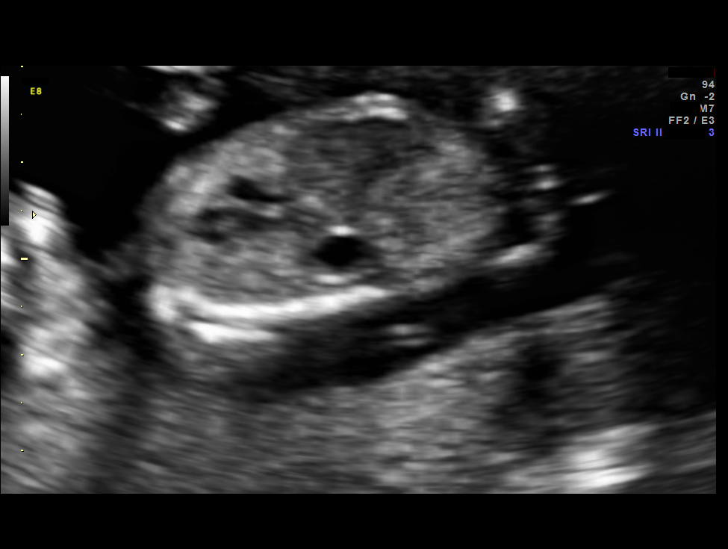
[im 11/32]
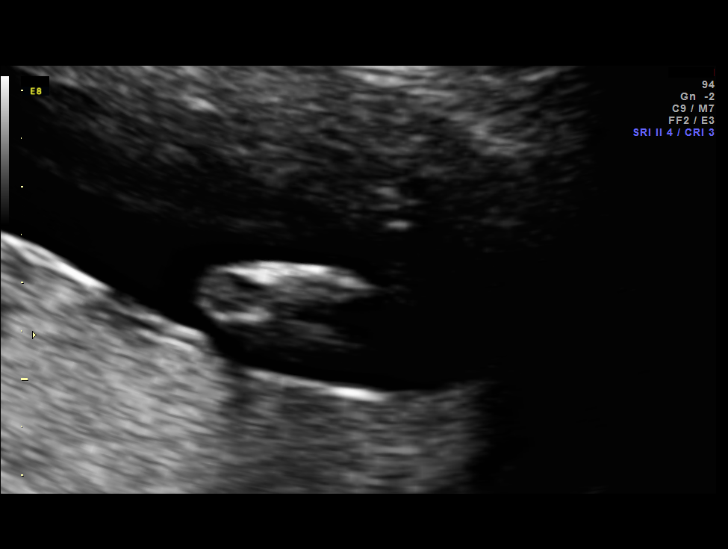
[im 13/32]
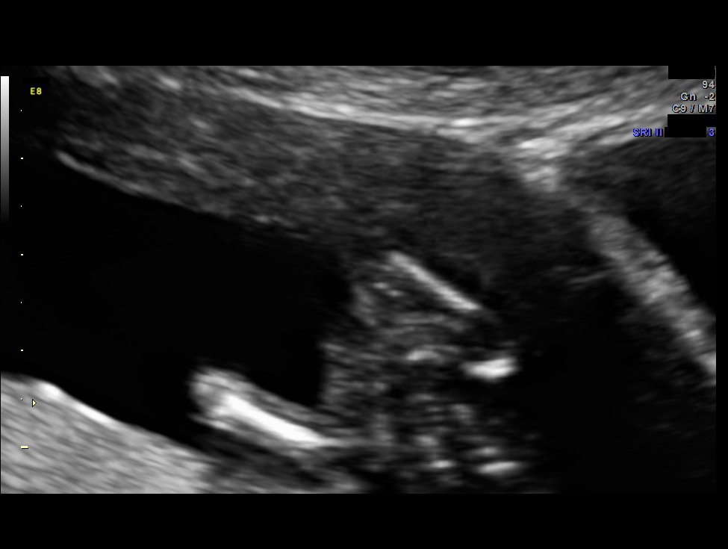
[im 17/32]
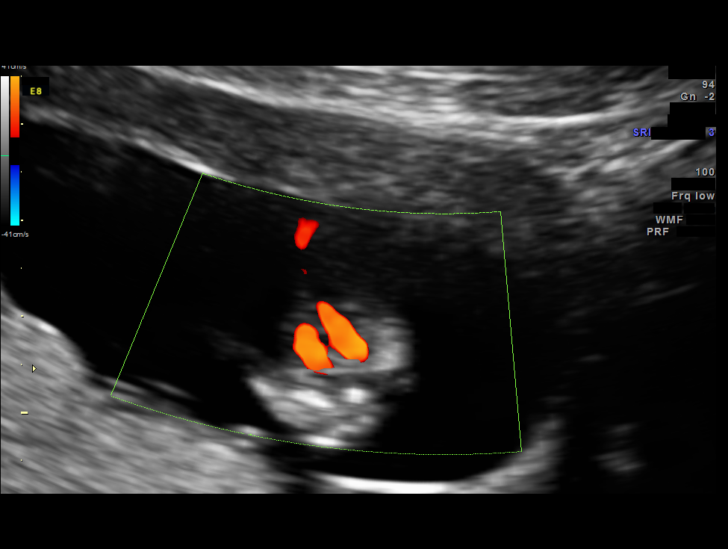
[im 19/32]
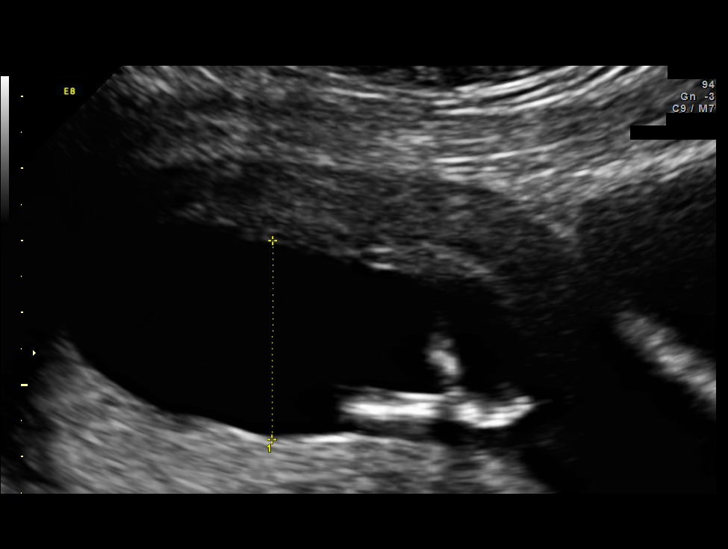
[im 21/32]
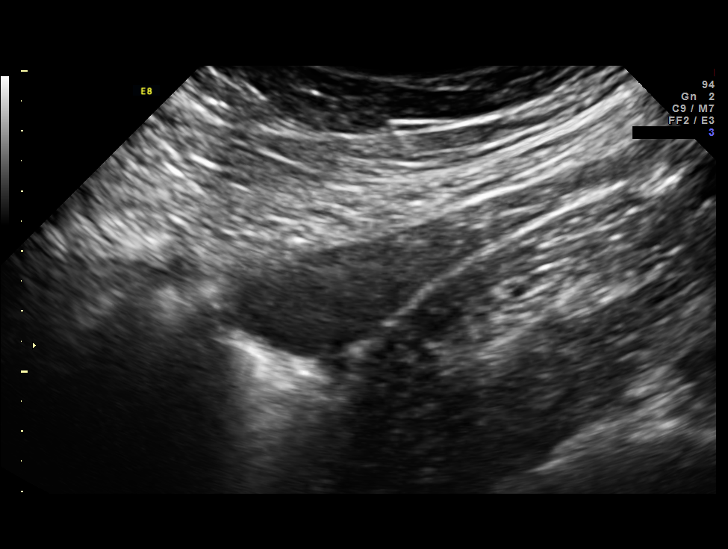
[im 23/32]
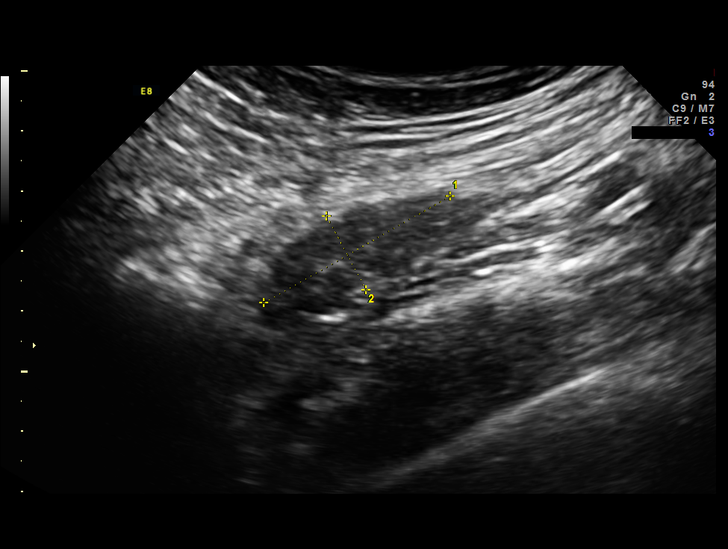
[im 26/32]
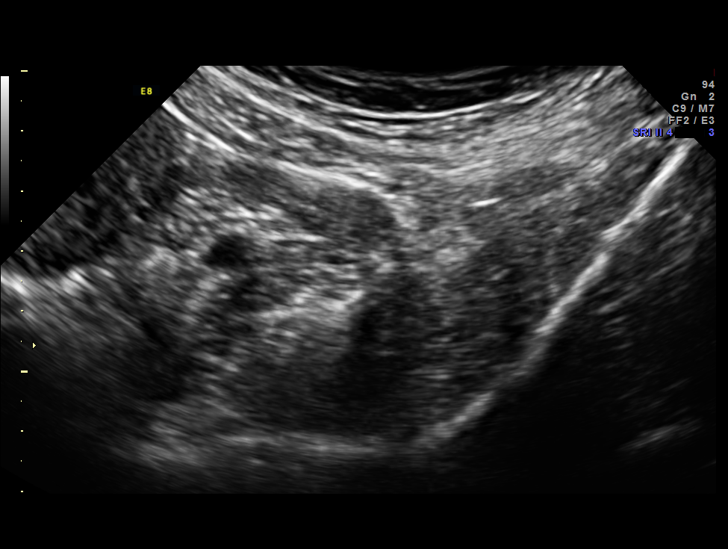
[im 28/32]
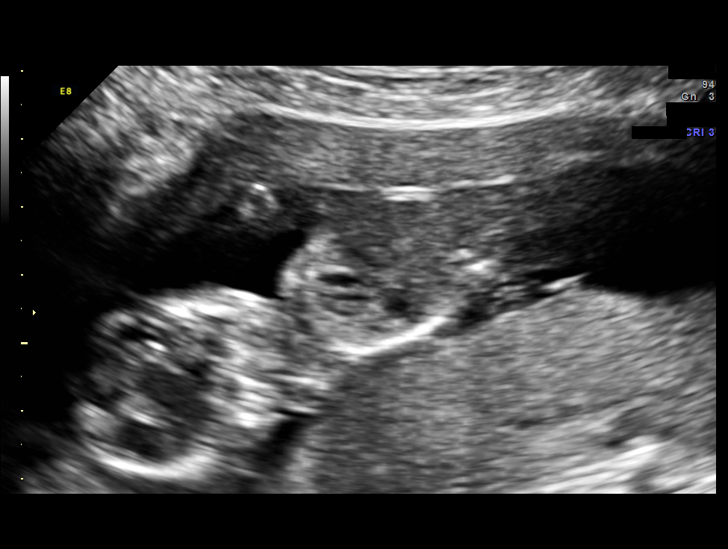
[im 30/32]
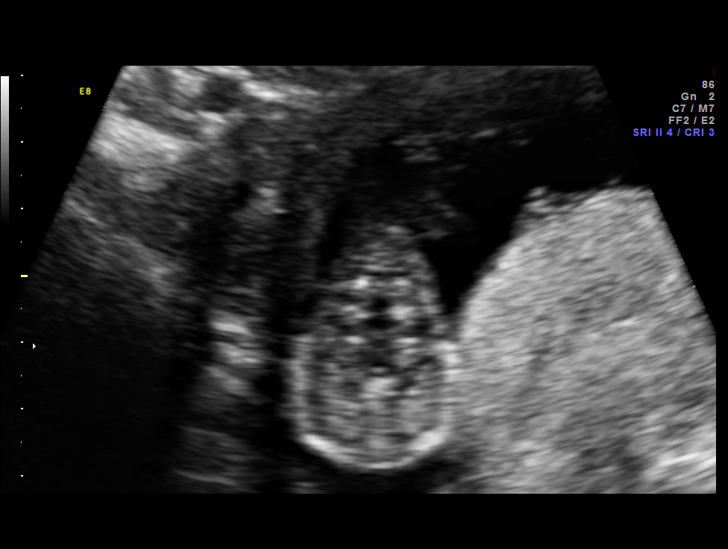

[13 of 28 positions shown; findings below may reference images not displayed]

OBSTETRICS REPORT
                      (Signed Final 06/27/2013 [DATE])

Service(s) Provided

 US MFM OB COMP LESS THAN 14 WEEKS                     76801.4
Indications

 First trimester aneuploidy screen (NT)
 Cigarette smoker
Fetal Evaluation

 Num Of Fetuses:    1
 Fetal Heart Rate:  155                          bpm
 Cardiac Activity:  Observed
 Presentation:      Variable

 Amniotic Fluid
 AFI FV:      Subjectively within normal limits
                                             Larg Pckt:    2.76  cm
Biometry

 CRL:     79.6  mm     G. Age:  13w 5d                 EDD:    12/28/13
Gestational Age

 LMP:           13w 3d        Date:  03/25/13                 EDD:   12/30/13
 Best:          13w 3d     Det. By:  LMP  (03/25/13)          EDD:   12/30/13
Anatomy

 Cranium:          Appears normal         Cord Vessels:     Appears normal (3
                                                            vessel cord)
 Choroid Plexus:   Appears normal         Kidneys:          Appear normal
 Diaphragm:        Appears normal         Bladder:          Appears normal
 Stomach:          Appears normal, left   Lower             Appears normal
                   sided                  Extremities:
 Abdomen:          Appears normal         Upper             Appears normal
                                          Extremities:
 Abdominal Wall:   Appears nml (cord
                   insert, abd wall)

 Other:  Technically difficult due to early gestational age.
Cervix Uterus Adnexa
 Left Ovary:    Within normal limits.
 Right Ovary:   Within normal limits.
Impression

 Single IUP at 13w 3d
 Unable to measure an adequate NT due to fetal position (2nd
 attempt)
Recommendations

 Recommend quad screen between 15-20 weeks gestation
 Ultrasound for fetal anatomy at 18-20 weeks

## 2019-03-28 ENCOUNTER — Other Ambulatory Visit: Payer: Self-pay

## 2019-03-28 ENCOUNTER — Ambulatory Visit: Payer: BLUE CROSS/BLUE SHIELD | Admitting: Advanced Practice Midwife

## 2020-11-09 ENCOUNTER — Emergency Department
Admission: EM | Admit: 2020-11-09 | Discharge: 2020-11-09 | Disposition: A | Discharge status: Home / Self Care | Payer: Worker's Compensation | Attending: Emergency Medicine | Admitting: Emergency Medicine

## 2020-11-09 ENCOUNTER — Encounter: Payer: Self-pay | Admitting: Emergency Medicine

## 2020-11-09 ENCOUNTER — Other Ambulatory Visit: Payer: Self-pay

## 2020-11-09 DIAGNOSIS — Z041 Encounter for examination and observation following transport accident: Secondary | ICD-10-CM | POA: Insufficient documentation

## 2020-11-09 DIAGNOSIS — Y92410 Unspecified street and highway as the place of occurrence of the external cause: Secondary | ICD-10-CM | POA: Diagnosis not present
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
